# Patient Record
Sex: Female | Born: 1995
Health system: Southern US, Community
[De-identification: ages and names within clinical notes are randomized; demographics above are authoritative.]

## PROBLEM LIST (undated history)

## (undated) ENCOUNTER — Emergency Department (HOSPITAL_COMMUNITY): Payer: BLUE CROSS/BLUE SHIELD

## (undated) DIAGNOSIS — F32A Depression, unspecified: Secondary | ICD-10-CM

## (undated) DIAGNOSIS — F419 Anxiety disorder, unspecified: Secondary | ICD-10-CM

## (undated) DIAGNOSIS — F329 Major depressive disorder, single episode, unspecified: Secondary | ICD-10-CM

## (undated) DIAGNOSIS — F909 Attention-deficit hyperactivity disorder, unspecified type: Secondary | ICD-10-CM

## (undated) HISTORY — PX: APPENDECTOMY: SHX54

## (undated) HISTORY — DX: Attention-deficit hyperactivity disorder, unspecified type: F90.9

---
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
## 2017-12-02 DIAGNOSIS — F909 Attention-deficit hyperactivity disorder, unspecified type: Secondary | ICD-10-CM | POA: Diagnosis not present

## 2017-12-28 DIAGNOSIS — J069 Acute upper respiratory infection, unspecified: Secondary | ICD-10-CM | POA: Diagnosis not present

## 2018-11-03 DIAGNOSIS — F4310 Post-traumatic stress disorder, unspecified: Secondary | ICD-10-CM | POA: Diagnosis not present

## 2018-11-11 DIAGNOSIS — F4310 Post-traumatic stress disorder, unspecified: Secondary | ICD-10-CM | POA: Diagnosis not present

## 2018-12-11 DIAGNOSIS — F4310 Post-traumatic stress disorder, unspecified: Secondary | ICD-10-CM | POA: Diagnosis not present

## 2018-12-19 DIAGNOSIS — F4310 Post-traumatic stress disorder, unspecified: Secondary | ICD-10-CM | POA: Diagnosis not present

## 2019-01-15 DIAGNOSIS — F4310 Post-traumatic stress disorder, unspecified: Secondary | ICD-10-CM | POA: Diagnosis not present

## 2019-04-06 DIAGNOSIS — R040 Epistaxis: Secondary | ICD-10-CM | POA: Diagnosis not present

## 2019-04-06 DIAGNOSIS — J309 Allergic rhinitis, unspecified: Secondary | ICD-10-CM | POA: Diagnosis not present

## 2020-01-06 ENCOUNTER — Other Ambulatory Visit: Payer: Self-pay | Admitting: Gynecology

## 2020-01-06 DIAGNOSIS — N6315 Unspecified lump in the right breast, overlapping quadrants: Secondary | ICD-10-CM | POA: Diagnosis not present

## 2020-01-06 DIAGNOSIS — N6310 Unspecified lump in the right breast, unspecified quadrant: Secondary | ICD-10-CM

## 2020-01-25 ENCOUNTER — Other Ambulatory Visit: Payer: BLUE CROSS/BLUE SHIELD

## 2020-03-10 ENCOUNTER — Ambulatory Visit: Payer: BLUE CROSS/BLUE SHIELD

## 2020-03-11 ENCOUNTER — Ambulatory Visit: Payer: BLUE CROSS/BLUE SHIELD | Attending: Internal Medicine

## 2020-03-11 DIAGNOSIS — Z23 Encounter for immunization: Secondary | ICD-10-CM

## 2020-03-11 NOTE — Progress Notes (Signed)
   Covid-19 Vaccination Clinic  Name:  Kiara Riley    MRN: [id number] DOB: 1996/05/29  03/11/2020  [name] was observed post Covid-19 immunization for 15 minutes without incident. She was provided with Vaccine Information Sheet and instruction to access the V-Safe system.   [name] was instructed to call 911 with any severe reactions post vaccine: Marland Kitchen Difficulty breathing  . Swelling of face and throat  . A fast heartbeat  . A bad rash all over body  . Dizziness and weakness   Immunizations Administered    Name Date Dose VIS Date Route   Pfizer COVID-19 Vaccine 03/11/2020  2:11 PM 0.3 mL 01/06/2019 Intramuscular   Manufacturer: [employer], [employer]   Lot: [id number]   NDC: [address]-0

## 2020-04-04 ENCOUNTER — Ambulatory Visit: Payer: BLUE CROSS/BLUE SHIELD | Attending: Internal Medicine

## 2021-01-03 DIAGNOSIS — F331 Major depressive disorder, recurrent, moderate: Secondary | ICD-10-CM | POA: Diagnosis not present

## 2021-02-10 DIAGNOSIS — F331 Major depressive disorder, recurrent, moderate: Secondary | ICD-10-CM | POA: Diagnosis not present

## 2021-03-10 DIAGNOSIS — F411 Generalized anxiety disorder: Secondary | ICD-10-CM | POA: Diagnosis not present

## 2021-04-25 DIAGNOSIS — F5001 Anorexia nervosa, restricting type: Secondary | ICD-10-CM | POA: Diagnosis not present

## 2021-05-09 DIAGNOSIS — F5001 Anorexia nervosa, restricting type: Secondary | ICD-10-CM | POA: Diagnosis not present
